# Patient Record
Sex: Male | Born: 1960 | Marital: Married | Smoking: Never smoker
Health system: Southern US, Community
[De-identification: ages and names within clinical notes are randomized; demographics above are authoritative.]

## PROBLEM LIST (undated history)

## (undated) DIAGNOSIS — R202 Paresthesia of skin: Secondary | ICD-10-CM

## (undated) DIAGNOSIS — E785 Hyperlipidemia, unspecified: Secondary | ICD-10-CM

## (undated) DIAGNOSIS — G459 Transient cerebral ischemic attack, unspecified: Secondary | ICD-10-CM

## (undated) DIAGNOSIS — I10 Essential (primary) hypertension: Secondary | ICD-10-CM

## (undated) HISTORY — DX: Transient cerebral ischemic attack, unspecified: G45.9

## (undated) HISTORY — DX: Hyperlipidemia, unspecified: E78.5

## (undated) HISTORY — PX: TONSILLECTOMY: SUR1361

## (undated) HISTORY — DX: Essential (primary) hypertension: I10

## (undated) HISTORY — DX: Paresthesia of skin: R20.2

---
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
## 2017-08-12 DIAGNOSIS — E785 Hyperlipidemia, unspecified: Secondary | ICD-10-CM | POA: Diagnosis not present

## 2017-08-12 DIAGNOSIS — I10 Essential (primary) hypertension: Secondary | ICD-10-CM | POA: Diagnosis not present

## 2017-08-12 DIAGNOSIS — R7301 Impaired fasting glucose: Secondary | ICD-10-CM | POA: Diagnosis not present

## 2017-08-12 DIAGNOSIS — Z125 Encounter for screening for malignant neoplasm of prostate: Secondary | ICD-10-CM | POA: Diagnosis not present

## 2017-08-28 ENCOUNTER — Ambulatory Visit (INDEPENDENT_AMBULATORY_CARE_PROVIDER_SITE_OTHER): Payer: 59

## 2017-08-28 ENCOUNTER — Encounter: Payer: Self-pay | Admitting: Podiatry

## 2017-08-28 ENCOUNTER — Ambulatory Visit (INDEPENDENT_AMBULATORY_CARE_PROVIDER_SITE_OTHER): Payer: 59 | Admitting: Podiatry

## 2017-08-28 DIAGNOSIS — M1000 Idiopathic gout, unspecified site: Secondary | ICD-10-CM

## 2017-08-28 DIAGNOSIS — M2010 Hallux valgus (acquired), unspecified foot: Secondary | ICD-10-CM

## 2017-08-28 DIAGNOSIS — M779 Enthesopathy, unspecified: Secondary | ICD-10-CM | POA: Diagnosis not present

## 2017-08-28 MED ORDER — TRIAMCINOLONE ACETONIDE 10 MG/ML IJ SUSP
10.0000 mg | Freq: Once | INTRAMUSCULAR | Status: AC
  Administered 2017-08-28: 10 mg

## 2017-08-28 NOTE — Progress Notes (Signed)
   Subjective:    Patient ID: Matthew Hayes, male    DOB: 09/19/1961, 56 y.o.   MRN: [id number]  HPI  Chief Complaint  Patient presents with  . Bunions    RT       Review of Systems  Musculoskeletal: Positive for arthralgias.  All other systems reviewed and are negative.      Objective:   Physical Exam        Assessment & Plan:

## 2017-08-28 NOTE — Patient Instructions (Addendum)
Bunion A bunion is a bump on the base of the big toe that forms when the bones of the big toe joint move out of position. Bunions may be small at first, but they often get larger over time. The can make walking painful. What are the causes? A bunion may be caused by:  Wearing narrow or pointed shoes that force the big toe to press against the other toes.  Abnormal foot development that causes the foot to roll inward (pronate).  Changes in the foot that are caused by certain diseases, such as rheumatoid arthritis and polio.  A foot injury.  What increases the risk? The following factors may make you more likely to develop this condition:  Wearing shoes that squeeze the toes together.  Having certain diseases, such as: ? Rheumatoid arthritis. ? Polio. ? Cerebral palsy.  Having family members who have bunions.  Being born with a foot deformity, such as flat feet or low arches.  Doing activities that put a lot of pressure on the feet, such as ballet dancing.  What are the signs or symptoms? The main symptom of a bunion is a noticeable bump on the big toe. Other symptoms may include:  Pain.  Swelling around the big toe.  Redness and inflammation.  Thick or hardened skin on the big toe or between the toes.  Stiffness or loss of motion in the big toe.  Trouble with walking.  How is this diagnosed? A bunion may be diagnosed based on your symptoms, medical history, and activities. You may have tests, such as:  X-rays. These allow your health care provider to check the position of the bones in your foot and look for damage to your joint. They also help your health care provider to determine the severity of your bunion and the best way to treat it.  Joint aspiration. In this test, a sample of fluid is removed from the toe joint. This test, which may be done if you are in a lot of pain, helps to rule out diseases that cause painful swelling of the joints, such as  arthritis.  How is this treated? There is no cure for a bunion, but treatment can help to prevent a bunion from getting worse. Treatment depends on the severity of your symptoms. Your health care provider may recommend:  Wearing shoes that have a wide toe box.  Using bunion pads to cushion the affected area.  Taping your toes together to keep them in a normal position.  Placing a device inside your shoe (orthotics) to help reduce pressure on your toe joint.  Taking medicine to ease pain, inflammation, and swelling.  Applying heat or ice to the affected area.  Doing stretching exercises.  Surgery to remove scar tissue and move the toes back into their normal position. This treatment is rare.  Follow these instructions at home:  Support your toe joint with proper footwear, shoe padding, or taping as told by your health care provider.  Take over-the-counter and prescription medicines only as told by your health care provider.  If directed, apply ice to the injured area: ? Put ice in a plastic bag. ? Place a towel between your skin and the bag. ? Leave the ice on for 20 minutes, 2-3 times per day.  If directed, apply heat to the affected area before you exercise. Use the heat source that your health care provider recommends, such as a moist heat pack or a heating pad. ? Place a towel between your   skin and the heat source. ? Leave the heat on for 20-30 minutes. ? Remove the heat if your skin turns bright red. This is especially important if you are unable to feel pain, heat, or cold. You may have a greater risk of getting burned.  Do exercises as told by your health care provider.  Keep all follow-up visits as told by your health care provider. Contact a health care provider if:  Your symptoms get worse.  Your symptoms do not improve in 2 weeks. Get help right away if:  You have severe pain and trouble with walking. This information is not intended to replace advice given  to you by your health care provider. Make sure you discuss any questions you have with your health care provider. Document Released: 12/15/2005 Document Revised: 05/22/2016 Document Reviewed: 07/15/2015 Elsevier Interactive Patient Education  2018 [employer] Calcium Pyrophosphate Deposition Calcium pyrophosphate deposition (CPPD), which is also called pseudogout, is a type of arthritis that causes pain, swelling, and inflammation in a joint. The joint pain can be severe and may last for days. If it is not treated, the pain may last much longer. Attacks of CPPD may come and go. This condition usually affects one joint at a time. The joints that are affected most commonly are the knees, but this condition can also affect the wrists, elbows, shoulders, or ankles. CPPD is similar to gout. Both conditions result from the buildup of crystals in the joint. However, CPPD is caused by a type of crystal that is different than the crystals that cause gout. What are the causes? This condition is caused by the buildup of calcium pyrophosphate dihydrate crystals in the joint. The reason why this buildup occurs is not known. The condition may be passed down from parent to child (hereditary). What increases the risk? This condition is more likely to develop in people who: Are over 54 years old. Have a family history of the condition. Have had joint replacement surgery. Have had a recent injury. Have certain medical conditions, such as hemophilia, ochronosis, amyloidosis, or hormonal disorders. Have low blood magnesium levels.  What are the signs or symptoms? Symptoms of this condition include: Pain in a joint. The pain may: Be intense and constant. Come on quickly. Get worse with movement. Last from several days to a few weeks. Redness, swelling, and warmth at the joint. Stiffness of the joint.  How is this diagnosed? To diagnose this condition, your health care provider will use a needle to remove  fluid from the joint. The fluid will be examined under a microscope to check for the crystals that cause CPPD. You may also have imaging tests, such as: X-rays. Ultrasound.  How is this treated? There is no way to remove the crystals from the joint and no way to cure this condition. However, treatment can relieve symptoms and improve joint function. Treatment may include: Nonsteroidal anti-inflammatory drugs (NSAIDs) to reduce inflammation and pain. Medicines to help prevent attacks. Injections of medicine (cortisone) into the joint to reduce pain and swelling. Physical therapy to improve joint function.  Follow these instructions at home: Take medicines only as directed by your health care provider. Rest the affected joints until your symptoms start to go away. Keep your affected joints raised (elevated) when possible. This will help to reduce swelling. If directed, apply ice to the affected area: Put ice in a plastic bag. Place a towel between your skin and the bag. Leave the ice on for 20 minutes, 2-3 times  per day. If the painful joint is in your leg, use crutches as directed by your health care provider. When your symptoms start to go away, begin to exercise regularly or do physical therapy. Talk with your health care provider or physical therapist about what types of exercise are safe for you. Low-impact exercise may be best. This includes walking, swimming, bicycling, and water aerobics. Maintain a healthy weight so your joints do not need to bear more weight than necessary. Contact a health care provider if: You have an increase in joint pain that is not relieved with medicine. Your joint becomes more red, swollen, or stiff. You have a fever. You have a skin rash. This information is not intended to replace advice given to you by your health care provider. Make sure you discuss any questions you have with your health care provider. Document Released: 09/06/2004 Document Revised:  05/22/2016 Document Reviewed: 11/22/2014 Elsevier Interactive Patient Education  [employer].

## 2017-08-28 NOTE — Progress Notes (Signed)
Subjective:    Patient ID: Matthew Hayes, male   DOB: 56 y.o.   MRN: [id number]   HPI patient states she's getting a lot of pain in his right foot on both sides of the joint with inflammation fluid and knows that he has structural bunion deformity but not sure if this is the problem    Review of Systems  All other systems reviewed and are negative.       Objective:  Physical Exam  Constitutional: He appears well-developed and well-nourished.  Cardiovascular: Intact distal pulses.   Pulmonary/Chest: Effort normal.  Musculoskeletal: Normal range of motion.  Neurological: He is alert.  Skin: Skin is warm.  Nursing note and vitals reviewed.  neurovascular status intact muscle strength was adequate range of motion within normal limits with patient found to have moderate structural deformity of the first and fifth MPJ right with inflammation fluid buildup around the joint surfaces but no indication currently of an acute process. 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 Patient will wider shoes and ultimately may require structural correction  X-rays indicate there is elevation of the intermetatarsal angle right with structural bunion and tailor's bunion deformity

## 2017-08-29 LAB — URIC ACID: Uric Acid, Serum: 6.7 mg/dL (ref 4.0–8.0)

## 2017-08-29 LAB — SEDIMENTATION RATE: SED RATE: 5 mm/h (ref 0–20)

## 2017-09-01 LAB — C-REACTIVE PROTEIN: CRP: 1.5 mg/L (ref <8.0)

## 2017-09-01 LAB — RHEUMATOID FACTOR: Rhuematoid fact SerPl-aCnc: <14 IU/mL (ref <14)

## 2017-09-01 LAB — ANA, IFA COMPREHENSIVE PANEL
Anti Nuclear Antibody(ANA): NEGATIVE
ENA SM Ab Ser-aCnc: <1
SCLERODERMA (SCL-70) (ENA) ANTIBODY, IGG: NEGATIVE
SM/RNP: NEGATIVE
SSA (Ro) (ENA) Antibody, IgG: <1
SSB (La) (ENA) Antibody, IgG: <1
ds DNA Ab: <1 IU/mL

## 2017-09-02 ENCOUNTER — Ambulatory Visit: Payer: Self-pay | Admitting: Podiatry

## 2018-02-01 DIAGNOSIS — Z719 Counseling, unspecified: Secondary | ICD-10-CM | POA: Diagnosis not present

## 2018-03-08 DIAGNOSIS — E785 Hyperlipidemia, unspecified: Secondary | ICD-10-CM | POA: Diagnosis not present

## 2018-03-08 DIAGNOSIS — Z0000 Encounter for general adult medical examination without abnormal findings: Secondary | ICD-10-CM | POA: Diagnosis not present

## 2018-03-08 DIAGNOSIS — Z125 Encounter for screening for malignant neoplasm of prostate: Secondary | ICD-10-CM | POA: Diagnosis not present

## 2018-03-08 DIAGNOSIS — R7301 Impaired fasting glucose: Secondary | ICD-10-CM | POA: Diagnosis not present

## 2018-03-08 DIAGNOSIS — I10 Essential (primary) hypertension: Secondary | ICD-10-CM | POA: Diagnosis not present

## 2018-06-11 DIAGNOSIS — J0100 Acute maxillary sinusitis, unspecified: Secondary | ICD-10-CM | POA: Diagnosis not present

## 2018-08-03 DIAGNOSIS — R42 Dizziness and giddiness: Secondary | ICD-10-CM | POA: Diagnosis not present

## 2018-08-03 DIAGNOSIS — R202 Paresthesia of skin: Secondary | ICD-10-CM | POA: Diagnosis not present

## 2018-08-03 DIAGNOSIS — E7800 Pure hypercholesterolemia, unspecified: Secondary | ICD-10-CM | POA: Diagnosis not present

## 2018-08-03 DIAGNOSIS — R200 Anesthesia of skin: Secondary | ICD-10-CM | POA: Diagnosis not present

## 2018-08-03 DIAGNOSIS — I6521 Occlusion and stenosis of right carotid artery: Secondary | ICD-10-CM | POA: Diagnosis not present

## 2018-08-03 DIAGNOSIS — I10 Essential (primary) hypertension: Secondary | ICD-10-CM | POA: Diagnosis not present

## 2018-08-03 DIAGNOSIS — R079 Chest pain, unspecified: Secondary | ICD-10-CM | POA: Diagnosis not present

## 2018-08-04 DIAGNOSIS — X32XXXD Exposure to sunlight, subsequent encounter: Secondary | ICD-10-CM | POA: Diagnosis not present

## 2018-08-04 DIAGNOSIS — L570 Actinic keratosis: Secondary | ICD-10-CM | POA: Diagnosis not present

## 2018-08-04 DIAGNOSIS — L255 Unspecified contact dermatitis due to plants, except food: Secondary | ICD-10-CM | POA: Diagnosis not present

## 2018-08-18 ENCOUNTER — Other Ambulatory Visit: Payer: Self-pay | Admitting: Family Medicine

## 2018-08-18 DIAGNOSIS — R9389 Abnormal findings on diagnostic imaging of other specified body structures: Secondary | ICD-10-CM

## 2018-08-18 DIAGNOSIS — G459 Transient cerebral ischemic attack, unspecified: Secondary | ICD-10-CM

## 2018-08-31 ENCOUNTER — Ambulatory Visit
Admission: RE | Admit: 2018-08-31 | Discharge: 2018-08-31 | Disposition: A | Discharge status: Home / Self Care | Payer: 59 | Source: Ambulatory Visit | Attending: Family Medicine | Admitting: Family Medicine

## 2018-08-31 DIAGNOSIS — G459 Transient cerebral ischemic attack, unspecified: Secondary | ICD-10-CM

## 2018-08-31 DIAGNOSIS — R9389 Abnormal findings on diagnostic imaging of other specified body structures: Secondary | ICD-10-CM

## 2018-08-31 DIAGNOSIS — I671 Cerebral aneurysm, nonruptured: Secondary | ICD-10-CM | POA: Diagnosis not present

## 2018-09-01 ENCOUNTER — Ambulatory Visit: Payer: 59 | Admitting: Podiatry

## 2018-09-01 ENCOUNTER — Encounter: Payer: Self-pay | Admitting: Podiatry

## 2018-09-01 ENCOUNTER — Other Ambulatory Visit: Payer: Self-pay | Admitting: Podiatry

## 2018-09-01 ENCOUNTER — Ambulatory Visit (INDEPENDENT_AMBULATORY_CARE_PROVIDER_SITE_OTHER): Payer: 59

## 2018-09-01 DIAGNOSIS — M7752 Other enthesopathy of left foot: Secondary | ICD-10-CM | POA: Diagnosis not present

## 2018-09-01 DIAGNOSIS — M779 Enthesopathy, unspecified: Secondary | ICD-10-CM | POA: Diagnosis not present

## 2018-09-01 DIAGNOSIS — M79672 Pain in left foot: Secondary | ICD-10-CM

## 2018-09-01 DIAGNOSIS — M778 Other enthesopathies, not elsewhere classified: Secondary | ICD-10-CM

## 2018-09-01 MED ORDER — DICLOFENAC SODIUM 75 MG PO TBEC: 75.0000 mg | DELAYED_RELEASE_TABLET | Freq: Two times a day (BID) | ORAL | 2 refills | Status: DC

## 2018-09-01 MED ORDER — TRIAMCINOLONE ACETONIDE 10 MG/ML IJ SUSP
10.0000 mg | Freq: Once | INTRAMUSCULAR | Status: AC
  Administered 2018-09-01: 10 mg

## 2018-09-01 NOTE — Progress Notes (Signed)
Subjective:   Patient ID: Matthew Hayes, male   DOB: 57 y.o.   MRN: [id number]   HPI Patient presents stating that he has had a lot of pain on top of his left foot and he does not remember specific injury.  Patient also has had history of bunion deformity right and states that has been pretty good since we treated the last time and had a tentative diagnosis of gout.  States the pain in the left is been present for 2 months and is worse with shoe gear   ROS      Objective:  Physical Exam  Neurovascular status intact with patient found to have inflammation pain of the dorsal left foot and the extensor tendon complex is localized in nature midfoot with no distal pain or medial lateral pain with mild discomfort on the right foot     Assessment:  Tendinitis dorsal left with mild inflammation right     Plan:  H&P condition reviewed and today had a careful injection of the dorsal tendon group left 3 mg Kenalog 5 mg Xylocaine advised on heat therapy shoe gear modifications as well as lacing and reappoint again as symptoms indicate  X-ray indicates no indications that there is a stress fracture or advanced arthritic issue in the midfoot left

## 2018-09-13 DIAGNOSIS — R7301 Impaired fasting glucose: Secondary | ICD-10-CM | POA: Diagnosis not present

## 2018-09-13 DIAGNOSIS — Z23 Encounter for immunization: Secondary | ICD-10-CM | POA: Diagnosis not present

## 2018-09-13 DIAGNOSIS — R202 Paresthesia of skin: Secondary | ICD-10-CM | POA: Diagnosis not present

## 2018-09-13 DIAGNOSIS — R079 Chest pain, unspecified: Secondary | ICD-10-CM | POA: Diagnosis not present

## 2018-10-05 DIAGNOSIS — L308 Other specified dermatitis: Secondary | ICD-10-CM | POA: Diagnosis not present

## 2018-10-18 DIAGNOSIS — R0789 Other chest pain: Secondary | ICD-10-CM | POA: Diagnosis not present

## 2018-10-18 DIAGNOSIS — R200 Anesthesia of skin: Secondary | ICD-10-CM | POA: Diagnosis not present

## 2018-10-18 DIAGNOSIS — E7800 Pure hypercholesterolemia, unspecified: Secondary | ICD-10-CM | POA: Diagnosis not present

## 2018-10-24 IMAGING — MR MR MRA HEAD W/O CM
1 series · 11 of 48 positions shown · non-contrast
Comparison: None.

CLINICAL DATA: Abnormal outside CTA, reportedly with a possible 2
mm right MCA bifurcation aneurysm. Left neck stiffness with left
face and arm numbness for 1 month.

EXAM:
MRA HEAD WITHOUT CONTRAST
TECHNIQUE: Angiographic images of the Circle of Willis were obtained using MRA
technique without intravenous contrast.

[Series 5: [id number] · axial · 0.6mm · 0.26mm/px · z∈[-50,+36]mm · 11 of 168 slices shown]
[im 11/168]
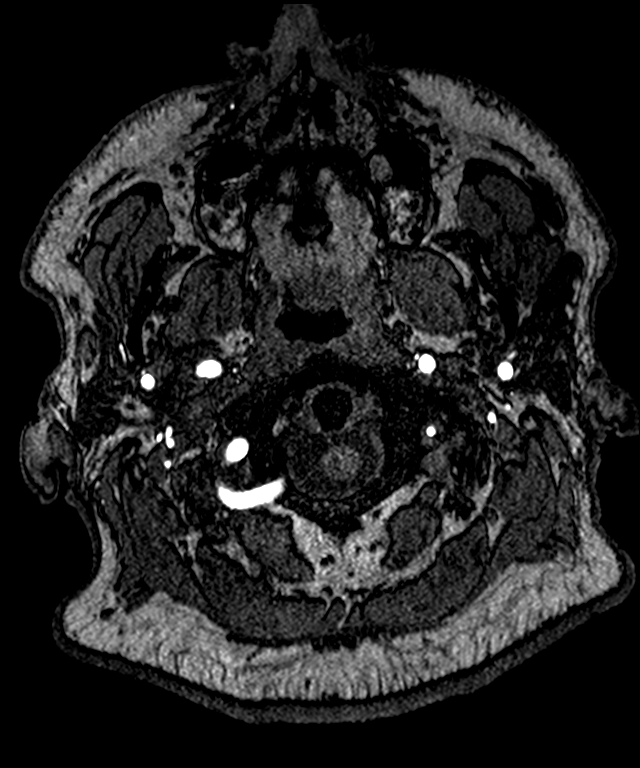
[im 29/168]
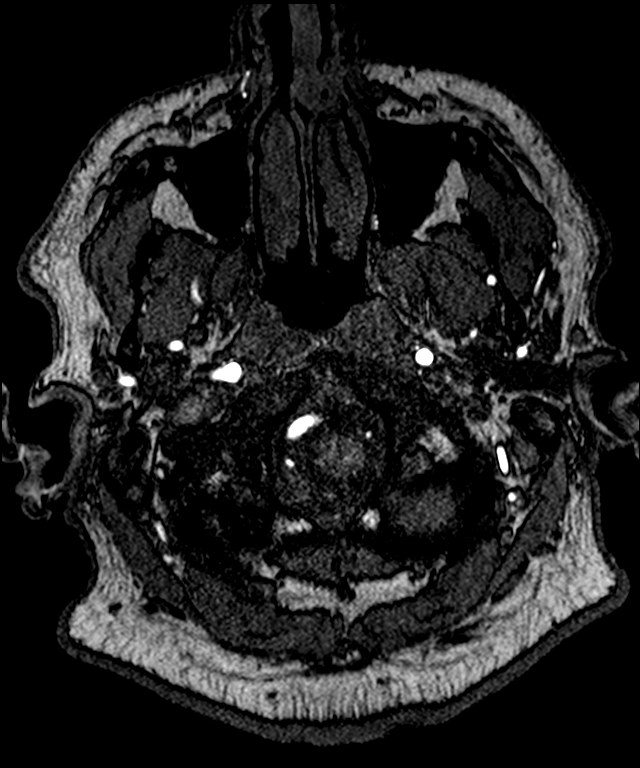
[im 32/168]
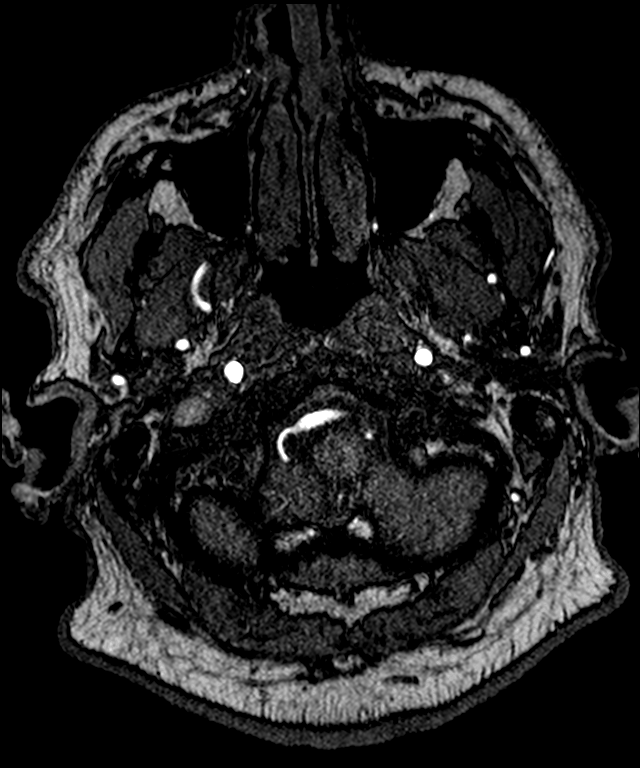
[im 54/168]
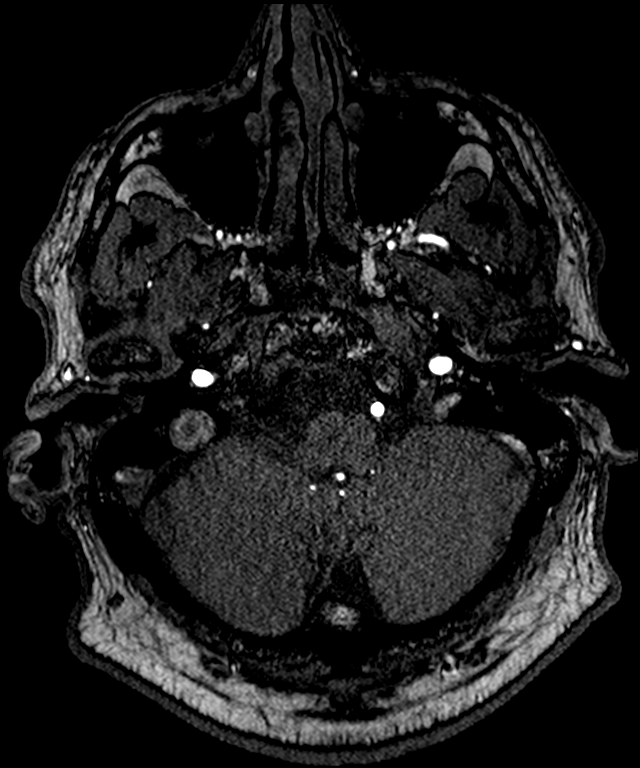
[im 75/168]
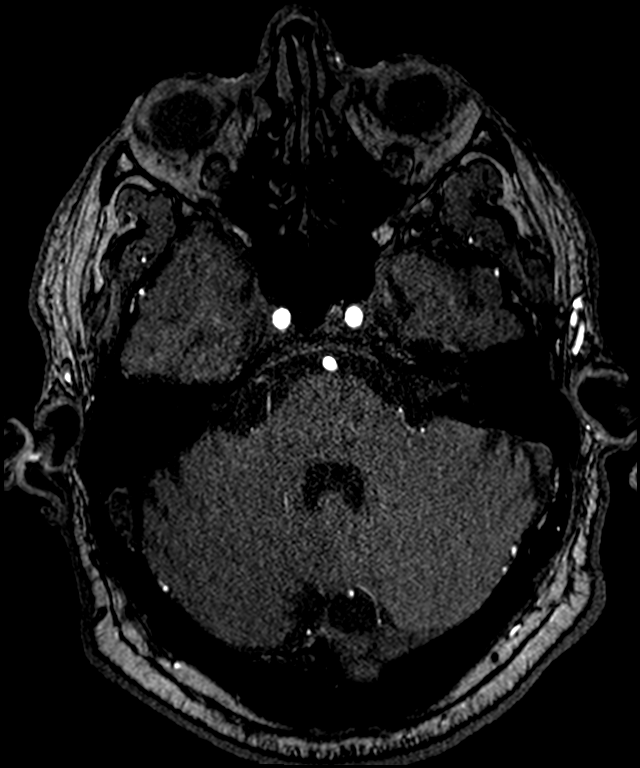
[im 86/168]
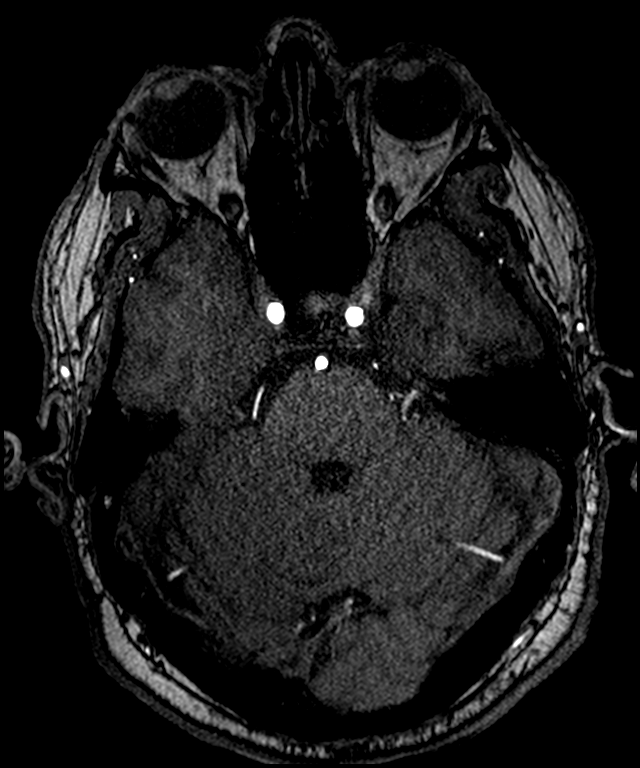
[im 96/168]
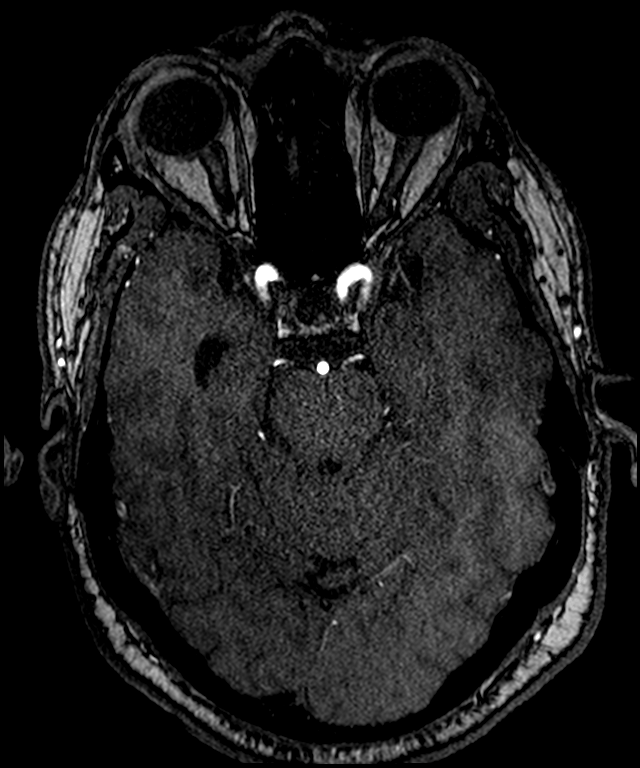
[im 118/168]
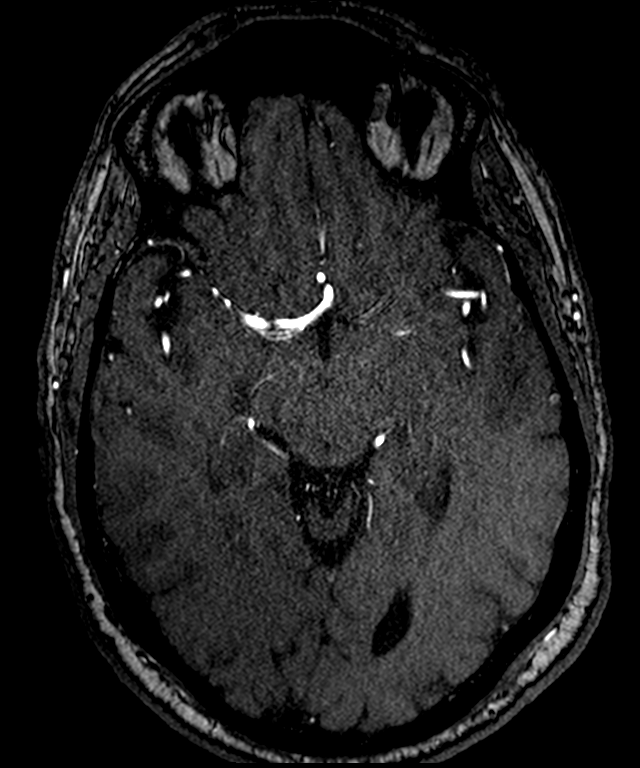
[im 139/168]
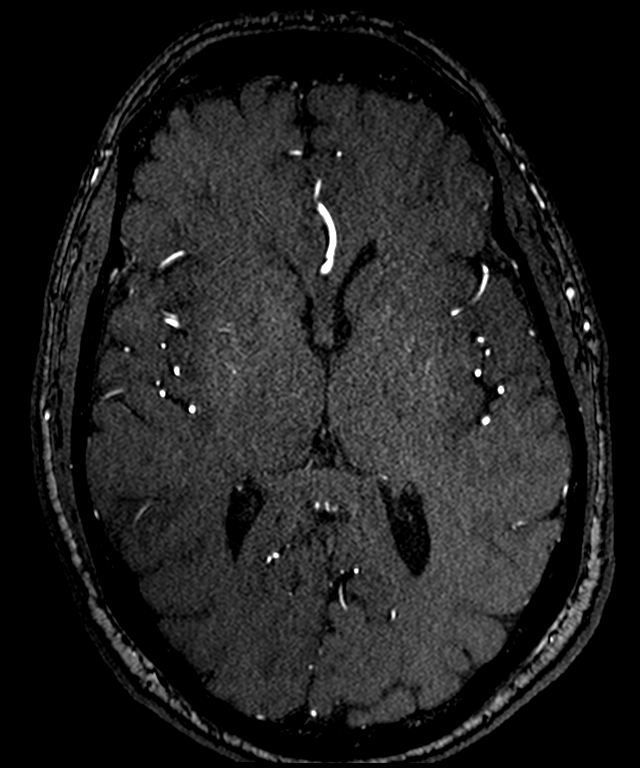
[im 143/168]
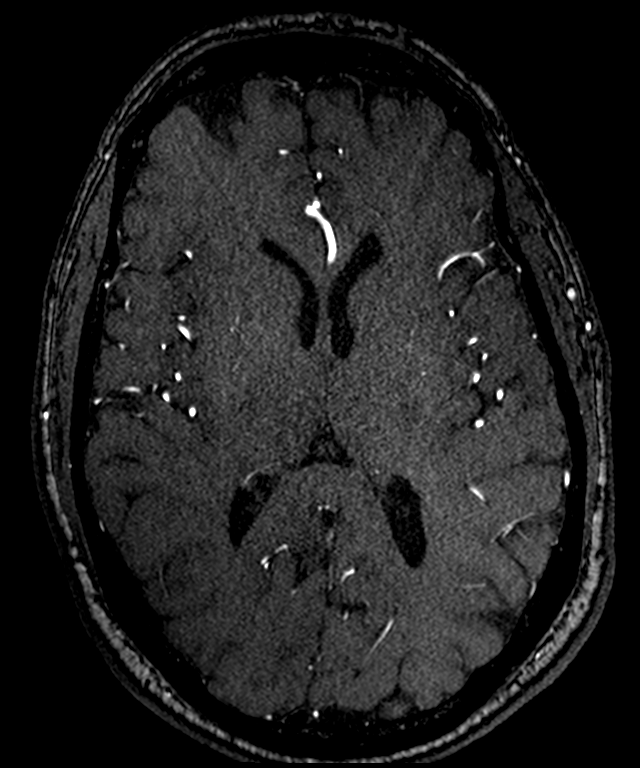
[im 160/168]
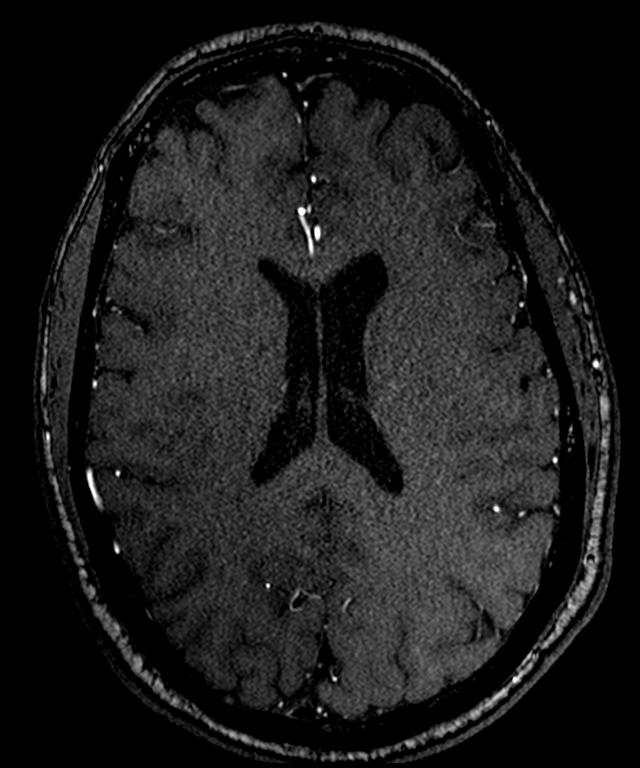

[11 of 48 positions shown; findings below may reference images not displayed]

FINDINGS: The visualized distal vertebral artery is widely patent and
dominant, supplying the basilar. The left vertebral artery is small
and terminates in PICA. Patent AICA and SCA origins are identified
bilaterally. The basilar artery is widely patent. There are small
right and diminutive or absent left posterior communicating
arteries. PCAs are patent without evidence of significant stenosis.

The internal carotid arteries are widely patent from skull base to
carotid termini. Tiny infundibula are noted from the supraclinoid
ICAs bilaterally. ACAs and MCAs are patent without evidence of
proximal branch occlusion or significant stenosis. No aneurysm is
identified.
IMPRESSION: Negative head MRA.  No aneurysm identified.

## 2018-11-08 DIAGNOSIS — E785 Hyperlipidemia, unspecified: Secondary | ICD-10-CM | POA: Diagnosis not present

## 2018-11-08 DIAGNOSIS — R0789 Other chest pain: Secondary | ICD-10-CM | POA: Diagnosis not present

## 2019-04-07 ENCOUNTER — Ambulatory Visit: Payer: 59 | Admitting: Cardiology

## 2019-04-07 ENCOUNTER — Telehealth: Payer: Self-pay

## 2019-04-07 ENCOUNTER — Other Ambulatory Visit: Payer: Self-pay

## 2019-04-07 ENCOUNTER — Encounter: Payer: Self-pay | Admitting: Cardiology

## 2019-04-07 VITALS — BP 162/95 | HR 89 | Ht 69.0 in | Wt 262.0 lb

## 2019-04-07 DIAGNOSIS — I10 Essential (primary) hypertension: Secondary | ICD-10-CM

## 2019-04-07 DIAGNOSIS — R002 Palpitations: Secondary | ICD-10-CM

## 2019-04-07 DIAGNOSIS — K219 Gastro-esophageal reflux disease without esophagitis: Secondary | ICD-10-CM

## 2019-04-07 NOTE — Telephone Encounter (Signed)
Ok to bring in. Please go through screening questions.  Thanks MJP

## 2019-04-07 NOTE — Progress Notes (Signed)
Follow up visit  Subjective:   Matthew Hayes, male    DOB: 05/22/1961, 58 y.o.   MRN: [id number]   Chief Complaint  Patient presents with  . Palpitations    pt c/o heart flutters , burping a lot     HPI  58 y.o. Caucasian male with hypertension, hyperlipidemia, here for acute visit for palpitations.   Patient's symptoms has only occurred last night and this morning. Both times, they occurred after eating his meals. He reports burping sensation, that is worse with laying down and better with sitting up. He denies any heart racing sensation, chest pain, shortness of breath. Blood pressure elevated today, but usually normal.    Past Medical History:  Diagnosis Date  . Hyperlipidemia   . Hypertension      History reviewed. No pertinent surgical history.   Social History   Socioeconomic History  . Marital status: Unknown    Spouse name: Not on file  . Number of children: Not on file  . Years of education: Not on file  . Highest education level: Not on file  Occupational History  . Not on file  Social Needs  . Financial resource strain: Not on file  . Food insecurity:    Worry: Not on file    Inability: Not on file  . Transportation needs:    Medical: Not on file    Non-medical: Not on file  Tobacco Use  . Smoking status: Never Smoker  . Smokeless tobacco: Never Used  Substance and Sexual Activity  . Alcohol use: No  . Drug use: No  . Sexual activity: Not on file  Lifestyle  . Physical activity:    Days per week: Not on file    Minutes per session: Not on file  . Stress: Not on file  Relationships  . Social connections:    Talks on phone: Not on file    Gets together: Not on file    Attends religious service: Not on file    Active member of club or organization: Not on file    Attends meetings of clubs or organizations: Not on file    Relationship status: Not on file  . Intimate partner violence:    Fear of current or ex partner: Not on file   Emotionally abused: Not on file    Physically abused: Not on file    Forced sexual activity: Not on file  Other Topics Concern  . Not on file  Social History Narrative  . Not on file     Family History  Problem Relation Age of Onset  . Stroke Mother     Current Outpatient Medications on File Prior to Visit  Medication Sig Dispense Refill  . diclofenac (VOLTAREN) 75 MG EC tablet Take 1 tablet (75 mg total) by mouth 2 (two) times daily. 50 tablet 2  . losartan (COZAAR) 25 MG tablet Take 25 mg by mouth daily.    . simvastatin (ZOCOR) 40 MG tablet Take 40 mg by mouth every evening.  1   No current facility-administered medications on file prior to visit.     Cardiovascular studies:  EKG 04/07/2019: Sinus rhythm 87 bpm Normal EKG  Exercise Treadmill Stress Test 11/08/2018:  Indication:  Chest pain The patient exercised on Bruce protocol for  09:04 min. Patient achieved  10.24 METS and reached HR   180 bpm, which is  127 % of maximum age-predicted HR.  Stress test terminated due to fatigue.   Exercise capacity was  normal. HR Response to Exercise: Appropriate. BP Response to Exercise: Normal resting BP- appropriate response. Chest Pain: none. Arrhythmias: none. Resting EKG demonstrates Normal sinus rhythm. ST Changes: With peak exercise, there were 1 mm horizontal ST depressions in leads V4-V6 that persisted two min into recovery.   Overall Impression: Positive stress test typical of ischemia. Recommendations: Consider further cardiac evaluation , if clinically indicated.  Review of Systems  Constitution: Negative for decreased appetite, malaise/fatigue, weight gain and weight loss.  HENT: Negative for congestion.   Eyes: Negative for visual disturbance.  Cardiovascular: Negative for chest pain, dyspnea on exertion, leg swelling, palpitations and syncope.  Respiratory: Negative for shortness of breath.   Endocrine: Negative for cold intolerance.  Hematologic/Lymphatic:  Does not bruise/bleed easily.  Skin: Negative for itching and rash.  Musculoskeletal: Negative for myalgias.  Gastrointestinal: Positive for bloating and heartburn. Negative for abdominal pain, nausea and vomiting.  Genitourinary: Negative for dysuria.  Neurological: Negative for dizziness and weakness.  Psychiatric/Behavioral: The patient is not nervous/anxious.   All other systems reviewed and are negative.        Vitals:   04/07/19 1343  BP: (!) 162/95  Pulse: 89  SpO2: 95%    Objective:   Physical Exam  Constitutional: He is oriented to person, place, and time. He appears well-developed and well-nourished. No distress.  HENT:  Head: Normocephalic and atraumatic.  Eyes: Pupils are equal, round, and reactive to light. Conjunctivae are normal.  Neck: No JVD present.  Cardiovascular: Normal rate, regular rhythm and intact distal pulses.  Pulmonary/Chest: Effort normal and breath sounds normal. He has no wheezes. He has no rales.  Abdominal: Soft. Bowel sounds are normal. There is no rebound.  Musculoskeletal:        General: No edema.  Lymphadenopathy:    He has no cervical adenopathy.  Neurological: He is alert and oriented to person, place, and time. No cranial nerve deficit.  Skin: Skin is warm and dry.  Psychiatric: He has a normal mood and affect.  Nursing note and vitals reviewed.         Assessment & Recommendations:   58 y.o. Caucasian male with here for acute visit for palpitations.   1. Palpitations On further review, I do not think his symptoms are related to heart rhythm. Resting EKG is normal. I suspect GERD  2. Gastroesophageal reflux disease, esophagitis presence not specified Recommend OTC omeprazole.  3. Hypertension Blood pressure elevated today, but usually normal. No change made today.  Follow up VV in 3 weeks to ensure improvement in symptoms.      Elder Negus, MD [facility] [place] Pager: [phone] Office:  [phone] If no answer Cell [phone]

## 2019-04-25 ENCOUNTER — Encounter: Payer: Self-pay | Admitting: Cardiology

## 2019-04-26 ENCOUNTER — Other Ambulatory Visit: Payer: Self-pay

## 2019-04-26 ENCOUNTER — Encounter: Payer: Self-pay | Admitting: Cardiology

## 2019-04-26 ENCOUNTER — Ambulatory Visit (INDEPENDENT_AMBULATORY_CARE_PROVIDER_SITE_OTHER): Payer: 59 | Admitting: Cardiology

## 2019-04-26 VITALS — BP 122/78 | HR 83 | Ht 69.0 in | Wt 255.0 lb

## 2019-04-26 DIAGNOSIS — R002 Palpitations: Secondary | ICD-10-CM

## 2019-04-26 NOTE — Progress Notes (Signed)
\   Virtual Visit via Video Note   Subjective:   Matthew Hayes, male    DOB: [birth year], 58 y.o.   MRN: [id number]   I connected with the patient on 04/26/19 by a video enabled telemedicine application and verified that I am speaking with the correct person using two identifiers.     I discussed the limitations of evaluation and management by telemedicine and the availability of in person appointments. The patient expressed understanding and agreed to proceed.   This visit type was conducted due to national recommendations for restrictions regarding the COVID-19 Pandemic (e.g. social distancing).  This format is felt to be most appropriate for this patient at this time.  All issues noted in this document were discussed and addressed.  No physical exam was performed (except for noted visual exam findings with Tele health visits).  The patient has consented to conduct a Tele health visit and understands insurance will be billed.     Chief complaint:  Palpitations   HPI  58 y.o. [name] with hypertension, palpitations.  Patient continues to have random episodes of palpitations lasting for a few min, always while at rest. He walks 15,000 steps everyday without any chest pain, shortness of breath symptoms. He is convinced that his symptoms are a side effect of valsartan and wants to come off it.   I have re reviewed his exercise treadmill stress test from 10/2018. His ST depressions are <1 mm upsloping/horizontal, and are equivocal of ischemia, at best.   Past Medical History:  Diagnosis Date  . Hyperlipidemia   . Hypertension       Social History   Socioeconomic History  . Marital status: Married    Spouse name: Not on file  . Number of children: 3  . Years of education: Not on file  . Highest education level: Not on file  Occupational History  . Not on file  Social Needs  . Financial resource strain: Not on file  . Food insecurity:    Worry: Not on file   Inability: Not on file  . Transportation needs:    Medical: Not on file    Non-medical: Not on file  Tobacco Use  . Smoking status: Never Smoker  . Smokeless tobacco: Never Used  Substance and Sexual Activity  . Alcohol use: No  . Drug use: No  . Sexual activity: Not on file  Lifestyle  . Physical activity:    Days per week: Not on file    Minutes per session: Not on file  . Stress: Not on file  Relationships  . Social connections:    Talks on phone: Not on file    Gets together: Not on file    Attends religious service: Not on file    Active member of club or organization: Not on file    Attends meetings of clubs or organizations: Not on file    Relationship status: Not on file  . Intimate partner violence:    Fear of current or ex partner: Not on file    Emotionally abused: Not on file    Physically abused: Not on file    Forced sexual activity: Not on file  Other Topics Concern  . Not on file  Social History Narrative  . Not on file     Family History  Problem Relation Age of Onset  . Stroke Mother   . Hypertension Father   . Hypertension Sister   . Hypertension Brother  Current Outpatient Medications on File Prior to Visit  Medication Sig Dispense Refill  . Multiple Vitamins-Minerals (MULTIVITAMIN WITH MINERALS) tablet Take 1 tablet by mouth daily.    . diclofenac (VOLTAREN) 75 MG EC tablet Take 1 tablet (75 mg total) by mouth 2 (two) times daily. (Patient not taking: Reported on 04/07/2019) 50 tablet 2  . losartan (COZAAR) 100 MG tablet Take 100 mg by mouth daily.    . simvastatin (ZOCOR) 40 MG tablet Take 40 mg by mouth every evening.  1   No current facility-administered medications on file prior to visit.     Cardiovascular studies:  EKG 04/07/2019: Sinus rhythm 87 bpm Normal EKG  Exercise Treadmill Stress Test 11/08/2018:  Indication: Chest pain The patient exercised on Bruce protocol for 09:04 min. Patient achieved 10.24 METS and reached  HR 180 bpm, which is 127 % of maximum age-predicted HR. Stress test terminated due to fatigue.   Exercise capacity was normal. HR Response to Exercise: Appropriate. BP Response to Exercise: Normal resting BP- appropriate response. Chest Pain: none. Arrhythmias: none. Resting EKG demonstrates Normal sinus rhythm. ST Changes: With peak exercise, there were 1 mm horizontal ST depressions in leads V4-V6 that persisted two min into recovery.   Overall Impression: Positive stress test typical of ischemia. Recommendations: Consider further cardiac evaluation , if clinically indicated.   Review of Systems  Constitution: Negative for decreased appetite, malaise/fatigue, weight gain and weight loss.  HENT: Negative for congestion.   Eyes: Negative for visual disturbance.  Cardiovascular: Positive for palpitations. Negative for chest pain, dyspnea on exertion, leg swelling and syncope.  Respiratory: Negative for shortness of breath.   Endocrine: Negative for cold intolerance.  Hematologic/Lymphatic: Does not bruise/bleed easily.  Skin: Negative for itching and rash.  Musculoskeletal: Negative for myalgias.  Gastrointestinal: Negative for abdominal pain, nausea and vomiting.  Genitourinary: Negative for dysuria.  Neurological: Negative for dizziness and weakness.  Psychiatric/Behavioral: The patient is not nervous/anxious.   All other systems reviewed and are negative.        Vitals:   04/26/19 1449  BP: 122/78  Pulse: 83   (Measured by the patient using a home BP monitor)   Observation/findings during video visit   Objective:    Physical Exam  Constitutional: He is oriented to person, place, and time. He appears well-developed and well-nourished. No distress.  Pulmonary/Chest: Effort normal.  Neurological: He is alert and oriented to person, place, and time.  Psychiatric: He has a normal mood and affect.  Nursing note and vitals reviewed.         Assessment &  Recommendations:   58 y.o. Caucasian male with here for acute visit for palpitations.   1. Palpitations Continues to have random episodes of palpitations. He wants to come off losartan and is convinced it is due to the medication. It is reasonable to try this, although I remain skeptical. Decrease to half tab a day and then stop. I offered to start him on a beta blocker, but he would like to hold off for now.  If symptoms persistent at 3 weeks, will then place event monitor.   I have re reviewed his exercise treadmill stress test from 10/2018. His ST depressions are <1 mm upsloping/horizontal, and are equivocal of ischemia, at best. Coupled with excellent exercise capacity and absence of angina symptoms, I do not think his stress test is positive for ischemia. Conitnue statin for now.    Elder Negus, MD [facility] Pager: [phone] Office: [phone]  If no answer Cell [phone]

## 2019-04-27 DIAGNOSIS — I10 Essential (primary) hypertension: Secondary | ICD-10-CM | POA: Diagnosis not present

## 2019-04-27 DIAGNOSIS — R7301 Impaired fasting glucose: Secondary | ICD-10-CM | POA: Diagnosis not present

## 2019-04-27 DIAGNOSIS — E785 Hyperlipidemia, unspecified: Secondary | ICD-10-CM | POA: Diagnosis not present

## 2019-04-28 ENCOUNTER — Encounter: Payer: Self-pay | Admitting: *Deleted

## 2019-05-02 ENCOUNTER — Other Ambulatory Visit: Payer: Self-pay

## 2019-05-02 ENCOUNTER — Encounter: Payer: Self-pay | Admitting: Diagnostic Neuroimaging

## 2019-05-02 ENCOUNTER — Ambulatory Visit (INDEPENDENT_AMBULATORY_CARE_PROVIDER_SITE_OTHER): Payer: 59 | Admitting: Diagnostic Neuroimaging

## 2019-05-02 DIAGNOSIS — G459 Transient cerebral ischemic attack, unspecified: Secondary | ICD-10-CM | POA: Diagnosis not present

## 2019-05-02 DIAGNOSIS — R202 Paresthesia of skin: Secondary | ICD-10-CM

## 2019-05-02 DIAGNOSIS — R200 Anesthesia of skin: Secondary | ICD-10-CM | POA: Diagnosis not present

## 2019-05-02 NOTE — Progress Notes (Signed)
[facility]  PATIENT: Matthew Hayes DOB: 1961-07-05  REFERRING CLINICIAN: C White HISTORY FROM: patient REASON FOR VISIT: new consult    HISTORICAL  CHIEF COMPLAINT:  Chief Complaint  Patient presents with  . Numbness    HISTORY OF PRESENT ILLNESS:   58 year old male with hypertension, hypercholesterolemia, here for evaluation of numbness since August 2019.  Patient had onset of left face and left arm numbness lasting for few minutes at a time in August 2018.  This was happening several times per week.  He had a severe headache at onset of symptoms.  Patient went to the hospital in [facility] for evaluation.  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  This was followed up with MRA of the head in [place] in September 2019 which was normal.  Patient continues to have episodes at least twice per week.  No specific triggering or aggravating factors.  No weakness.  No slurred speech, vision changes, trouble walking anything with his hand.  No ongoing headaches.  No twitching or convulsions.    REVIEW OF SYSTEMS: Full 14 system review of systems performed and negative with exception of: As per HPI.  ALLERGIES: Allergies  Allergen Reactions  . Vicodin [Hydrocodone-Acetaminophen] Nausea Only    Sick on stomach    HOME MEDICATIONS: Outpatient Medications Prior to Visit  Medication Sig Dispense Refill  . diclofenac (VOLTAREN) 75 MG EC tablet Take 1 tablet (75 mg total) by mouth 2 (two) times daily. 50 tablet 2  . losartan (COZAAR) 100 MG tablet Take 100 mg by mouth daily.    . Multiple Vitamins-Minerals (MULTIVITAMIN WITH MINERALS) tablet Take 1 tablet by mouth daily.    . simvastatin (ZOCOR) 40 MG tablet Take 40 mg by mouth every evening.  1   No facility-administered medications prior to visit.     PAST MEDICAL HISTORY: Past Medical History:   Diagnosis Date  . Hyperlipidemia   . Hypertension   . Paresthesia    left face    PAST SURGICAL HISTORY: No past surgical history on file.  FAMILY HISTORY: Family History  Problem Relation Age of Onset  . Stroke Mother   . Hypertension Father   . Hypertension Sister   . Hypertension Brother     SOCIAL HISTORY: Social History   Socioeconomic History  . Marital status: Married    Spouse name: Matthew Hayes  . Number of children: 3  . Years of education: Not on file  . Highest education level: Not on file  Occupational History    Comment: [employer]  Social Needs  . Financial resource strain: Not on file  . Food insecurity:    Worry: Not on file    Inability: Not on file  . Transportation needs:    Medical: Not on file    Non-medical: Not on file  Tobacco Use  . Smoking status: Never Smoker  . Smokeless tobacco: Never Used  Substance and Sexual Activity  . Alcohol use: No  . Drug use: No  . Sexual activity: Not on file  Lifestyle  . Physical activity:    Days per week: Not on file    Minutes per session: Not on file  . Stress: Not on file  Relationships  . Social connections:    Talks on phone: Not on file    Gets together: Not on file    Attends  religious service: Not on file    Active member of club or organization: Not on file    Attends meetings of clubs or organizations: Not on file    Relationship status: Not on file  . Intimate partner violence:    Fear of current or ex partner: Not on file    Emotionally abused: Not on file    Physically abused: Not on file    Forced sexual activity: Not on file  Other Topics Concern  . Not on file  Social History Narrative   Lives with wife     PHYSICAL EXAM   VIDEO EXAM  GENERAL EXAM/CONSTITUTIONAL:  Vitals: There were no vitals filed for this visit.  There is no height or weight on file to calculate BMI. Wt Readings from Last 3 Encounters:  04/26/19 255 lb (115.7 kg)  04/07/19 262  lb (118.8 kg)     Patient is in no distress; well developed, nourished and groomed; neck is supple   NEUROLOGIC: MENTAL STATUS:  No flowsheet data found.  awake, alert, oriented to person, place and time  recent and remote memory intact  normal attention and concentration  language fluent, comprehension intact, naming intact  fund of knowledge appropriate  CRANIAL NERVE:   2nd, 3rd, 4th, 6th - visual fields full to confrontation, extraocular muscles intact, no nystagmus  5th - facial sensation --> DECR TO LT IN LEFT V2, V3   7th - facial strength symmetric  8th - hearing intact  11th - shoulder shrug symmetric  12th - tongue protrusion midline  MOTOR:   NO TREMOR; NO DRIFT IN BUE  SENSORY:   normal and symmetric to light touch  COORDINATION:   fine finger movements normal      DIAGNOSTIC DATA (LABS, IMAGING, TESTING) - I reviewed patient records, labs, notes, testing and imaging myself where available.  No results found for: WBC, HGB, HCT, MCV, PLT No results found for: NA, K, CL, CO2, GLUCOSE, BUN, CREATININE, CALCIUM, PROT, ALBUMIN, AST, ALT, ALKPHOS, BILITOT, GFRNONAA, GFRAA No results found for: CHOL, HDL, LDLCALC, LDLDIRECT, TRIG, CHOLHDL No results found for: ZOXW9U No results found for: VITAMINB12 No results found for: TSH   08/31/18 MRA HEAD [I reviewed images myself and agree with interpretation. -VRP]  - normal   ASSESSMENT AND PLAN  58 y.o. year old male here with:  Dx:  1. TIA (transient ischemic attack)   2. Numbness and tingling of left side of face      Virtual Visit via Video Note  I connected with Matthew Hayes on 05/02/19 at  9:00 AM EDT by a video enabled telemedicine application and verified that I am speaking with the correct person using two identifiers.  Location: Patient: home  Provider: office   I discussed the limitations of evaluation and management by telemedicine and the availability of in person  appointments. The patient expressed understanding and agreed to proceed.   I discussed the assessment and treatment plan with the patient. The patient was provided an opportunity to ask questions and all were answered. The patient agreed with the plan and demonstrated an understanding of the instructions.   The patient was advised to call back or seek an in-person evaluation if the symptoms worsen or if the condition fails to improve as anticipated.  I provided 45 minutes of non-face-to-face time during this encounter.   PLAN:  59 y.o. with left face and arm numbness since August 2019.  Ddx: TIA (less likely), seizure (partial seizure) or complicated  migraine  INTERMITTENT NUMBNESS (left face and left arm) - check MRI brain, carotid u/s echocardiogram, EEG - start aspirin 81mg  daily - continue BP and lipid control   Orders Placed This Encounter  Procedures  . MR BRAIN W WO CONTRAST  . ECHOCARDIOGRAM COMPLETE  . EEG adult   Return in about 6 months (around 11/02/2019).    Suanne MarkerVIKRAM R. Keishawna Carranza, MD 05/02/2019, 9:08 AM Certified in Neurology, Neurophysiology and Neuroimaging  [facility] [address], [address] [place], [place] [address] [phone]

## 2019-05-04 ENCOUNTER — Telehealth: Payer: Self-pay | Admitting: Diagnostic Neuroimaging

## 2019-05-04 NOTE — Telephone Encounter (Signed)
no to the covid-19 questions MR Brain w/wo contrast Dr. Marjory Lies [facility] Auth: [id number] (exp. 05/04/19 to 06/18/19). Patient is scheduled at [facility] for 05/18/19.

## 2019-05-10 ENCOUNTER — Other Ambulatory Visit: Payer: Self-pay

## 2019-05-10 ENCOUNTER — Ambulatory Visit: Payer: 59

## 2019-05-10 DIAGNOSIS — R202 Paresthesia of skin: Secondary | ICD-10-CM

## 2019-05-10 DIAGNOSIS — G459 Transient cerebral ischemic attack, unspecified: Secondary | ICD-10-CM | POA: Diagnosis not present

## 2019-05-10 DIAGNOSIS — R200 Anesthesia of skin: Secondary | ICD-10-CM | POA: Diagnosis not present

## 2019-05-10 MED ORDER — GADOBENATE DIMEGLUMINE 529 MG/ML IV SOLN
20.0000 mL | Freq: Once | INTRAVENOUS | Status: AC | PRN
  Administered 2019-05-10: 10:00:00 20 mL via INTRAVENOUS

## 2019-05-12 DIAGNOSIS — R7301 Impaired fasting glucose: Secondary | ICD-10-CM | POA: Diagnosis not present

## 2019-05-12 DIAGNOSIS — I10 Essential (primary) hypertension: Secondary | ICD-10-CM | POA: Diagnosis not present

## 2019-05-12 DIAGNOSIS — R079 Chest pain, unspecified: Secondary | ICD-10-CM | POA: Diagnosis not present

## 2019-05-12 DIAGNOSIS — Z125 Encounter for screening for malignant neoplasm of prostate: Secondary | ICD-10-CM | POA: Diagnosis not present

## 2019-05-12 DIAGNOSIS — E785 Hyperlipidemia, unspecified: Secondary | ICD-10-CM | POA: Diagnosis not present

## 2019-05-12 DIAGNOSIS — R1310 Dysphagia, unspecified: Secondary | ICD-10-CM | POA: Diagnosis not present

## 2019-05-12 DIAGNOSIS — K219 Gastro-esophageal reflux disease without esophagitis: Secondary | ICD-10-CM | POA: Diagnosis not present

## 2019-05-16 ENCOUNTER — Encounter: Payer: Self-pay | Admitting: *Deleted

## 2019-05-16 ENCOUNTER — Ambulatory Visit: Payer: 59 | Admitting: Cardiology

## 2019-05-16 DIAGNOSIS — R112 Nausea with vomiting, unspecified: Secondary | ICD-10-CM | POA: Diagnosis not present

## 2019-05-16 DIAGNOSIS — K3189 Other diseases of stomach and duodenum: Secondary | ICD-10-CM | POA: Diagnosis not present

## 2019-05-16 DIAGNOSIS — R079 Chest pain, unspecified: Secondary | ICD-10-CM | POA: Diagnosis not present

## 2019-05-16 DIAGNOSIS — R1310 Dysphagia, unspecified: Secondary | ICD-10-CM | POA: Diagnosis not present

## 2019-05-16 DIAGNOSIS — K219 Gastro-esophageal reflux disease without esophagitis: Secondary | ICD-10-CM | POA: Diagnosis not present

## 2019-05-18 ENCOUNTER — Other Ambulatory Visit: Payer: 59

## 2019-05-24 NOTE — Progress Notes (Signed)
\   Virtual Visit via Video Note   Subjective:   Matthew Hayes, male    DOB: 07/01/1961, 58 y.o.   MRN: [id number]   I connected with the patient on 05/25/19 by a video enabled telemedicine application and verified that I am speaking with the correct person using two identifiers.     I discussed the limitations of evaluation and management by telemedicine and the availability of in person appointments. The patient expressed understanding and agreed to proceed.   This visit type was conducted due to national recommendations for restrictions regarding the COVID-19 Pandemic (e.g. social distancing).  This format is felt to be most appropriate for this patient at this time.  All issues noted in this document were discussed and addressed.  No physical exam was performed (except for noted visual exam findings with Tele health visits).  The patient has consented to conduct a Tele health visit and understands insurance will be billed.     Chief complaint:  Palpitations   HPI  58 y.o. [place] male with hypertension, palpitations.  At last visit 3 weeks ago, he was still having palpitations. He wanted to come off losartan and was convinced it is due to the medication. While I remained skeptical, I felt it was reasonable to try this. I decreased it to half tab a day and then stop. I offered to start him on a beta blocker, but he would like to hold off for now. If symptoms persisted after 3 weeks, I recommended placing an event monitor.   I also reviewed his exercise treadmill stress test from 10/2018. His ST depressions are <1 mm upsloping/horizontal, and are equivocal of ischemia, at best. Coupled with excellent exercise capacity and absence of angina symptoms, I did not think his stress test was positive for ischemia. I conitnued statin.   He cut losartan back to 1/2, then went back to 1 tab daily. Palpitations have self resolved.  Since then, he had an endoscopy, and was told that he has acid  reflux. He had brain MRI given numbness, which was normal.   He walks [id number] steps/day 4 days a week. He denies chest pain on walking. He has no more than usual, nonlimiting shortness of breath on walking up the hill.   Blood pressure is elevated today, but he states that it is usually lower than this.  Past Medical History:  Diagnosis Date  . Hyperlipidemia   . Hypertension   . Paresthesia    left face      Social History   Socioeconomic History  . Marital status: Married    Spouse name: Toniann FailWendy  . Number of children: 3  . Years of education: Not on file  . Highest education level: Not on file  Occupational History    Comment: [employer]  Social Needs  . Financial resource strain: Not on file  . Food insecurity:    Worry: Not on file    Inability: Not on file  . Transportation needs:    Medical: Not on file    Non-medical: Not on file  Tobacco Use  . Smoking status: Never Smoker  . Smokeless tobacco: Never Used  Substance and Sexual Activity  . Alcohol use: No  . Drug use: No  . Sexual activity: Not on file  Lifestyle  . Physical activity:    Days per week: Not on file    Minutes per session: Not on file  . Stress: Not on file  Relationships  .  Social connections:    Talks on phone: Not on file    Gets together: Not on file    Attends religious service: Not on file    Active member of club or organization: Not on file    Attends meetings of clubs or organizations: Not on file    Relationship status: Not on file  . Intimate partner violence:    Fear of current or ex partner: Not on file    Emotionally abused: Not on file    Physically abused: Not on file    Forced sexual activity: Not on file  Other Topics Concern  . Not on file  Social History Narrative   Lives with wife     Family History  Problem Relation Age of Onset  . Stroke Mother   . Hypertension Father   . Hypertension Sister   . Hypertension Brother      Current  Outpatient Medications on File Prior to Visit  Medication Sig Dispense Refill  . diclofenac (VOLTAREN) 75 MG EC tablet Take 1 tablet (75 mg total) by mouth 2 (two) times daily. 50 tablet 2  . losartan (COZAAR) 100 MG tablet Take 100 mg by mouth daily.    . Multiple Vitamins-Minerals (MULTIVITAMIN WITH MINERALS) tablet Take 1 tablet by mouth daily.    . simvastatin (ZOCOR) 40 MG tablet Take 40 mg by mouth every evening.  1   No current facility-administered medications on file prior to visit.     Cardiovascular studies:  EKG 04/07/2019: Sinus rhythm 87 bpm Normal EKG  Exercise Treadmill Stress Test 11/08/2018:  Indication: Chest pain The patient exercised on Bruce protocol for 09:04 min. Patient achieved 10.24 METS and reached HR 180 bpm, which is 127 % of maximum age-predicted HR. Stress test terminated due to fatigue.   Exercise capacity was normal. HR Response to Exercise: Appropriate. BP Response to Exercise: Normal resting BP- appropriate response. Chest Pain: none. Arrhythmias: none. Resting EKG demonstrates Normal sinus rhythm. ST Changes: With peak exercise, there were 1 mm horizontal ST depressions in leads V4-V6 that persisted two min into recovery.   Overall Impression: Positive stress test typical of ischemia. Recommendations: Consider further cardiac evaluation , if clinically indicated.   Review of Systems  Constitution: Negative for decreased appetite, malaise/fatigue, weight gain and weight loss.  HENT: Negative for congestion.   Eyes: Negative for visual disturbance.  Cardiovascular: Positive for palpitations. Negative for chest pain, dyspnea on exertion, leg swelling and syncope.  Respiratory: Negative for shortness of breath.   Endocrine: Negative for cold intolerance.  Hematologic/Lymphatic: Does not bruise/bleed easily.  Skin: Negative for itching and rash.  Musculoskeletal: Negative for myalgias.  Gastrointestinal: Negative for abdominal pain,  nausea and vomiting.  Genitourinary: Negative for dysuria.  Neurological: Negative for dizziness and weakness.  Psychiatric/Behavioral: The patient is not nervous/anxious.   All other systems reviewed and are negative.        Vitals:   05/25/19 0838  BP: (!) 156/97   (Measured by the patient using a home BP monitor)   Observation/findings during video visit   Objective:    Physical Exam  Constitutional: He is oriented to person, place, and time. He appears well-developed and well-nourished. No distress.  Pulmonary/Chest: Effort normal.  Neurological: He is alert and oriented to person, place, and time.  Psychiatric: He has a normal mood and affect.  Nursing note and vitals reviewed.         Assessment & Recommendations:   58 y.o. Caucasian male  with here for acute visit for palpitations.   1. Palpitations: Improved. Echocardiogram pending.   2. Hypertension: Suboptimal today. He is reluctant to add any medications at this time.   3. H/o abnormal stress test: I have re reviewed his exercise treadmill stress test from 10/2018. His ST depressions are <1 mm upsloping/horizontal, and are equivocal of ischemia, at best. Coupled with excellent exercise capacity and absence of angina symptoms, I do not think his stress test is positive for ischemia. Conitnue statin for now.   BP check visit after echocardiogram in two weeks.   Elder Negus, MD [facility] Pager: [phone] Office: [phone] If no answer Cell [phone]

## 2019-05-25 ENCOUNTER — Ambulatory Visit (INDEPENDENT_AMBULATORY_CARE_PROVIDER_SITE_OTHER): Payer: 59 | Admitting: Cardiology

## 2019-05-25 ENCOUNTER — Encounter: Payer: Self-pay | Admitting: Cardiology

## 2019-05-25 ENCOUNTER — Other Ambulatory Visit: Payer: Self-pay

## 2019-05-25 VITALS — BP 156/97 | Ht 69.0 in | Wt 250.0 lb

## 2019-05-25 DIAGNOSIS — R002 Palpitations: Secondary | ICD-10-CM

## 2019-05-25 DIAGNOSIS — I10 Essential (primary) hypertension: Secondary | ICD-10-CM | POA: Diagnosis not present

## 2019-05-30 ENCOUNTER — Ambulatory Visit (INDEPENDENT_AMBULATORY_CARE_PROVIDER_SITE_OTHER): Payer: 59 | Admitting: Diagnostic Neuroimaging

## 2019-05-30 ENCOUNTER — Other Ambulatory Visit: Payer: Self-pay

## 2019-05-30 DIAGNOSIS — R2990 Unspecified symptoms and signs involving the nervous system: Secondary | ICD-10-CM

## 2019-05-30 DIAGNOSIS — R200 Anesthesia of skin: Secondary | ICD-10-CM

## 2019-05-30 DIAGNOSIS — R202 Paresthesia of skin: Secondary | ICD-10-CM

## 2019-05-30 DIAGNOSIS — G459 Transient cerebral ischemic attack, unspecified: Secondary | ICD-10-CM

## 2019-05-31 NOTE — Procedures (Signed)
   [facility]  EEG (ELECTROENCEPHALOGRAM) REPORT   STUDY DATE: 05/30/19 PATIENT NAME: Matthew Hayes DOB: April 19, 1961 MRN: [id number]  ORDERING CLINICIAN: Joycelyn Schmid, MD   TECHNOLOGIST: Charlett Blake TECHNIQUE: Electroencephalogram was recorded utilizing standard 10-20 system of lead placement and reformatted into average and bipolar montages.  RECORDING TIME: 23 minutes  ACTIVATION: hyperventilation and photic stimulation  CLINICAL INFORMATION: 58 year old male with numbness.  FINDINGS: Posterior dominant background rhythms, which attenuate with eye opening, ranging 9-10 hertz and 30-40 microvolts. No focal, lateralizing, epileptiform activity or seizures are seen. Patient recorded in the awake and drowsy state.   IMPRESSION:   Normal EEG in the awake and drowsy states.    INTERPRETING PHYSICIAN:  Suanne Marker, MD Certified in Neurology, Neurophysiology and Neuroimaging  [facility] [address] [place], [place] [address] [phone]

## 2019-06-01 ENCOUNTER — Telehealth: Payer: Self-pay | Admitting: *Deleted

## 2019-06-01 NOTE — Telephone Encounter (Signed)
Spoke with patient and informed him his EEG result was normal. I advised he continue S+ASA, BP and lipid control, monitor symptoms and call us for any questions or concerns. Scheduled his 6 month FU. Patient verbalized understanding, appreciation.

## 2019-06-13 ENCOUNTER — Ambulatory Visit (INDEPENDENT_AMBULATORY_CARE_PROVIDER_SITE_OTHER): Payer: 59

## 2019-06-13 ENCOUNTER — Other Ambulatory Visit: Payer: Self-pay

## 2019-06-13 ENCOUNTER — Encounter: Payer: 59 | Admitting: Cardiology

## 2019-06-13 VITALS — BP 134/86 | HR 71

## 2019-06-13 DIAGNOSIS — G459 Transient cerebral ischemic attack, unspecified: Secondary | ICD-10-CM | POA: Diagnosis not present

## 2019-06-13 DIAGNOSIS — R200 Anesthesia of skin: Secondary | ICD-10-CM

## 2019-06-13 DIAGNOSIS — R202 Paresthesia of skin: Secondary | ICD-10-CM

## 2019-06-13 DIAGNOSIS — I10 Essential (primary) hypertension: Secondary | ICD-10-CM

## 2019-06-20 ENCOUNTER — Ambulatory Visit: Payer: 59 | Admitting: Cardiology

## 2019-06-24 NOTE — Telephone Encounter (Signed)
Please respond

## 2019-08-28 NOTE — Progress Notes (Signed)
Subjective:   Matthew Hayes, male    DOB: 10/31/1961, 58 y.o.   MRN: [id number]   Chief complaint:  Palpitations   HPI  58 y/o Caucasian make w/hypertension, h/o abnormal treadmill stress test, palpitations.  His palpitations/burning sensation has improved after treatment of acid reflux.  He continues to walk up to 18,000 steps every day without any complaints of chest pain or shortness of breath.  His only complaint remains low energy level.  He does endorse snoring at night.  Blood pressure elevated in the office today, but lower at home per the patient.  Past Medical History:  Diagnosis Date  . Hyperlipidemia   . Hypertension   . Paresthesia    left face      Social History   Socioeconomic History  . Marital status: Married    Spouse name: Toniann FailWendy  . Number of children: 3  . Years of education: Not on file  . Highest education level: Not on file  Occupational History    Comment: [employer]  Social Needs  . Financial resource strain: Not on file  . Food insecurity    Worry: Not on file    Inability: Not on file  . Transportation needs    Medical: Not on file    Non-medical: Not on file  Tobacco Use  . Smoking status: Never Smoker  . Smokeless tobacco: Never Used  Substance and Sexual Activity  . Alcohol use: No  . Drug use: No  . Sexual activity: Not on file  Lifestyle  . Physical activity    Days per week: Not on file    Minutes per session: Not on file  . Stress: Not on file  Relationships  . Social Musicianconnections    Talks on phone: Not on file    Gets together: Not on file    Attends religious service: Not on file    Active member of club or organization: Not on file    Attends meetings of clubs or organizations: Not on file    Relationship status: Not on file  . Intimate partner violence    Fear of current or ex partner: Not on file    Emotionally abused: Not on file    Physically abused: Not on file    Forced sexual activity:  Not on file  Other Topics Concern  . Not on file  Social History Narrative   Lives with wife     Family History  Problem Relation Age of Onset  . Stroke Mother   . Hypertension Father   . Hypertension Sister   . Hypertension Brother      Current Outpatient Medications on File Prior to Visit  Medication Sig Dispense Refill  . losartan (COZAAR) 100 MG tablet Take 100 mg by mouth daily.    . Multiple Vitamins-Minerals (MULTIVITAMIN WITH MINERALS) tablet Take 1 tablet by mouth daily.    . simvastatin (ZOCOR) 40 MG tablet Take 40 mg by mouth every evening.  1   No current facility-administered medications on file prior to visit.     Cardiovascular studies:  Echocardiogram 06/13/2019: Normal LV systolic function with EF 64%. Left ventricle cavity is normal in size. Normal global wall motion. Indeterminate diastolic filling pattern. Calculated EF 64%. Left atrial cavity is moderately dilated. Structurally normal mitral valve. Mild (Grade I) mitral regurgitation. Mild tricuspid regurgitation.  No evidence of pulmonary hypertension.  EKG 04/07/2019: Sinus rhythm 87 bpm Normal EKG  Exercise Treadmill Stress Test 11/08/2018:  Indication: Chest pain The patient exercised on Bruce protocol for 09:04 min. Patient achieved 10.24 METS and reached HR 180 bpm, which is 127 % of maximum age-predicted HR. Stress test terminated due to fatigue.   Exercise capacity was normal. HR Response to Exercise: Appropriate. BP Response to Exercise: Normal resting BP- appropriate response. Chest Pain: none. Arrhythmias: none. Resting EKG demonstrates Normal sinus rhythm. ST Changes: With peak exercise, there were 1 mm horizontal ST depressions in leads V4-V6 that persisted two min into recovery.   Overall Impression: Positive stress test typical of ischemia. Recommendations: Consider further cardiac evaluation , if clinically indicated.   Review of Systems  Constitution: Negative for  decreased appetite, malaise/fatigue, weight gain and weight loss.  HENT: Negative for congestion.   Eyes: Negative for visual disturbance.  Cardiovascular: Positive for palpitations. Negative for chest pain, dyspnea on exertion, leg swelling and syncope.  Respiratory: Negative for shortness of breath.   Endocrine: Negative for cold intolerance.  Hematologic/Lymphatic: Does not bruise/bleed easily.  Skin: Negative for itching and rash.  Musculoskeletal: Negative for myalgias.  Gastrointestinal: Negative for abdominal pain, nausea and vomiting.  Genitourinary: Negative for dysuria.  Neurological: Negative for dizziness and weakness.  Psychiatric/Behavioral: The patient is not nervous/anxious.   All other systems reviewed and are negative.        Vitals:   08/29/19 1005  BP: (!) 155/88  Pulse: 75  SpO2: 99%    Objective:    Physical Exam  Constitutional: He is oriented to person, place, and time. He appears well-developed and well-nourished. No distress.  HENT:  Head: Normocephalic and atraumatic.  Eyes: Pupils are equal, round, and reactive to light. Conjunctivae are normal.  Neck: No JVD present.  Cardiovascular: Normal rate, regular rhythm and intact distal pulses.  Pulmonary/Chest: Effort normal and breath sounds normal. He has no wheezes. He has no rales.  Abdominal: Soft. Bowel sounds are normal. There is no rebound.  Musculoskeletal:        General: No edema.  Lymphadenopathy:    He has no cervical adenopathy.  Neurological: He is alert and oriented to person, place, and time. No cranial nerve deficit.  Skin: Skin is warm and dry.  Psychiatric: He has a normal mood and affect.  Nursing note and vitals reviewed.      Assessment & Recommendations:   58 y/o Caucasian make w/hypertension, h/o abnormal treadmill stress test, palpitations.  1. Palpitations: Improved.  Structurally normal heart.  2. Hypertension: Suboptimal today. He is reluctant to add any  medications at this time.  Recommend regular exercise and weight loss.  3. H/o abnormal stress test: I have re reviewed his exercise treadmill stress test from 10/2018. His ST depressions are <1 mm upsloping/horizontal, and are equivocal of ischemia, at best. Coupled with excellent exercise capacity and absence of angina symptoms, I do not think his stress test is positive for ischemia. Conitnue statin for now.   4.  Fatigue, low energy: Multifactorial.  Suspect sleep apnea, and obesity contributing.  He is reluctant to refer to sleep study at this time.  Continue follow-up with PCP.  Follow-up in 1 year.  Nigel Mormon, MD [facility] Pager: [phone] Office: [phone] If no answer Cell [phone]

## 2019-08-29 ENCOUNTER — Encounter: Payer: Self-pay | Admitting: Cardiology

## 2019-08-29 ENCOUNTER — Ambulatory Visit (INDEPENDENT_AMBULATORY_CARE_PROVIDER_SITE_OTHER): Payer: 59 | Admitting: Cardiology

## 2019-08-29 ENCOUNTER — Other Ambulatory Visit: Payer: Self-pay

## 2019-08-29 VITALS — BP 155/88 | HR 75 | Ht 69.0 in | Wt 242.0 lb

## 2019-08-29 DIAGNOSIS — I10 Essential (primary) hypertension: Secondary | ICD-10-CM | POA: Diagnosis not present

## 2019-08-29 DIAGNOSIS — R002 Palpitations: Secondary | ICD-10-CM | POA: Diagnosis not present

## 2019-08-29 DIAGNOSIS — E785 Hyperlipidemia, unspecified: Secondary | ICD-10-CM

## 2019-08-29 DIAGNOSIS — E7800 Pure hypercholesterolemia, unspecified: Secondary | ICD-10-CM | POA: Diagnosis not present

## 2019-08-29 HISTORY — DX: Hyperlipidemia, unspecified: E78.5

## 2019-11-08 ENCOUNTER — Other Ambulatory Visit: Payer: Self-pay

## 2019-11-08 ENCOUNTER — Encounter: Payer: Self-pay | Admitting: Diagnostic Neuroimaging

## 2019-11-08 ENCOUNTER — Ambulatory Visit: Payer: 59 | Admitting: Diagnostic Neuroimaging

## 2019-11-08 VITALS — BP 128/78 | HR 78 | Temp 97.7°F | Ht 69.0 in | Wt 244.4 lb

## 2019-11-08 DIAGNOSIS — R200 Anesthesia of skin: Secondary | ICD-10-CM

## 2019-11-08 DIAGNOSIS — R202 Paresthesia of skin: Secondary | ICD-10-CM | POA: Diagnosis not present

## 2019-11-08 DIAGNOSIS — G43109 Migraine with aura, not intractable, without status migrainosus: Secondary | ICD-10-CM | POA: Diagnosis not present

## 2019-11-08 NOTE — Progress Notes (Signed)
[facility]  PATIENT: Matthew Hayes DOB: 1961-03-06  REFERRING CLINICIAN: C White HISTORY FROM: patient REASON FOR VISIT: new consult    HISTORICAL  CHIEF COMPLAINT:  Chief Complaint  Patient presents with  . Transient Ischemic Attack    rm 7, 6 month FU, "numbness in my face comes and goes, but in last 2 weeks its been consistent; Sunday the left side of my face and left arm had unusual sensation"    HISTORY OF PRESENT ILLNESS:   UPDATE (11/08/19, [facility]): Since last visit, doing about the same. Ongoing intermittent mild left sided HA. Intermittent numbness in left face and left arm (can last 1 hour up to 1-2 days at a time).  No triggers. Eating healthy. Exercising daily.   PRIOR HPI (05/02/19): 58 year old male with hypertension, hypercholesterolemia, here for evaluation of numbness since August 2019.  Patient had onset of left face and left arm numbness lasting for few minutes at a time in August 2018.  This was happening several times per week.  He had a severe headache at onset of symptoms.  Patient went to the hospital in [facility] for evaluation.  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  This was followed up with MRA of the head in [place] in September 2019 which was normal.  Patient continues to have episodes at least twice per week.  No specific triggering or aggravating factors.  No weakness.  No slurred speech, vision changes, trouble walking anything with his hand.  No ongoing headaches.  No twitching or convulsions.    REVIEW OF SYSTEMS: Full 14 system review of systems performed and negative with exception of: As per HPI.  ALLERGIES: Allergies  Allergen Reactions  . Vicodin [Hydrocodone-Acetaminophen] Nausea Only    Sick on stomach    HOME MEDICATIONS: Outpatient Medications Prior to Visit  Medication Sig Dispense Refill  . aspirin EC  81 MG tablet Take 81 mg by mouth daily.    Marland Kitchen losartan (COZAAR) 100 MG tablet Take 100 mg by mouth daily.    . Multiple Vitamins-Minerals (MULTIVITAMIN WITH MINERALS) tablet Take 1 tablet by mouth daily.    . simvastatin (ZOCOR) 40 MG tablet Take 40 mg by mouth every evening.  1   No facility-administered medications prior to visit.     PAST MEDICAL HISTORY: Past Medical History:  Diagnosis Date  . Hyperlipemia 08/29/2019  . Hyperlipidemia   . Hypertension   . Paresthesia    left face  . TIA (transient ischemic attack)     PAST SURGICAL HISTORY: No past surgical history on file.  FAMILY HISTORY: Family History  Problem Relation Age of Onset  . Stroke Mother   . Hypertension Father   . Hypertension Sister   . Hypertension Brother     SOCIAL HISTORY: Social History   Socioeconomic History  . Marital status: Married    Spouse name: Toniann Fail  . Number of children: 3  . Years of education: Not on file  . Highest education level: Not on file  Occupational History    Comment: [employer]  Social Needs  . Financial resource strain: Not on file  . Food insecurity    Worry: Not on file    Inability: Not on file  . Transportation needs    Medical: Not on file    Non-medical: Not on file  Tobacco Use  . Smoking  status: Never Smoker  . Smokeless tobacco: Never Used  Substance and Sexual Activity  . Alcohol use: No  . Drug use: No  . Sexual activity: Not on file  Lifestyle  . Physical activity    Days per week: Not on file    Minutes per session: Not on file  . Stress: Not on file  Relationships  . Social Musicianconnections    Talks on phone: Not on file    Gets together: Not on file    Attends religious service: Not on file    Active member of club or organization: Not on file    Attends meetings of clubs or organizations: Not on file    Relationship status: Not on file  . Intimate partner violence    Fear of current or ex partner: Not on file     Emotionally abused: Not on file    Physically abused: Not on file    Forced sexual activity: Not on file  Other Topics Concern  . Not on file  Social History Narrative   Lives with wife     PHYSICAL EXAM  GENERAL EXAM/CONSTITUTIONAL: Vitals:  Vitals:   11/08/19 0809  BP: 128/78  Pulse: 78  Temp: 97.7 F (36.5 C)  Weight: 244 lb 6.4 oz (110.9 kg)  Height: 5\' 9"  (1.753 m)     Body mass index is 36.09 kg/m. Wt Readings from Last 3 Encounters:  11/08/19 244 lb 6.4 oz (110.9 kg)  08/29/19 242 lb (109.8 kg)  05/25/19 250 lb (113.4 kg)     Patient is in no distress; well developed, nourished and groomed; neck is supple  CARDIOVASCULAR:  Examination of carotid arteries is normal; no carotid bruits  Regular rate and rhythm, no murmurs  Examination of peripheral vascular system by observation and palpation is normal  EYES:  Ophthalmoscopic exam of optic discs and posterior segments is normal; no papilledema or hemorrhages  No exam data present  MUSCULOSKELETAL:  Gait, strength, tone, movements noted in Neurologic exam below  NEUROLOGIC: MENTAL STATUS:  No flowsheet data found.  awake, alert, oriented to person, place and time  recent and remote memory intact  normal attention and concentration  language fluent, comprehension intact, naming intact  fund of knowledge appropriate  CRANIAL NERVE:   2nd - no papilledema on fundoscopic exam  2nd, 3rd, 4th, 6th - pupils equal and reactive to light, visual fields full to confrontation, extraocular muscles intact, no nystagmus  5th - facial sensation symmetric  7th - facial strength symmetric  8th - hearing intact  9th - palate elevates symmetrically, uvula midline  11th - shoulder shrug symmetric  12th - tongue protrusion midline  MOTOR:   normal bulk and tone, full strength in the BUE, BLE  SENSORY:   normal and symmetric to light touch, temperature, vibration  COORDINATION:    finger-nose-finger, fine finger movements normal  REFLEXES:   deep tendon reflexes present and symmetric  GAIT/STATION:   narrow based gait     DIAGNOSTIC DATA (LABS, IMAGING, TESTING) - I reviewed patient records, labs, notes, testing and imaging myself where available.  No results found for: WBC, HGB, HCT, MCV, PLT No results found for: NA, K, CL, CO2, GLUCOSE, BUN, CREATININE, CALCIUM, PROT, ALBUMIN, AST, ALT, ALKPHOS, BILITOT, GFRNONAA, GFRAA No results found for: CHOL, HDL, LDLCALC, LDLDIRECT, TRIG, CHOLHDL No results found for: [id number] No results found for: VITAMINB12 No results found for: TSH   08/31/18 MRA HEAD [I reviewed images myself and agree with  interpretation. -[facility]P]  - normal  05/10/19 MRI brain  - normal   06/13/19 carotid u/s Minimal stenosis in the left internal carotid artery bulb (<50%). Minimal stenosis in the left external carotid artery (<50%). There is very mild soft plaque bilateral carotid arteries without hemodynamically significant stenosis. Antegrade right vertebral artery flow. Antegrade left vertebral artery flow.  06/13/19 TTE Normal LV systolic function with EF 64%. Left ventricle cavity is normal in size. Normal global wall motion. Indeterminate diastolic filling pattern. Calculated EF 64%. Left atrial cavity is moderately dilated. Structurally normal mitral valve. Mild (Grade I) mitral regurgitation. Mild tricuspid regurgitation.  No evidence of pulmonary hypertension.  05/30/19 EEG - normal     ASSESSMENT AND PLAN  58 y.o. year old male here with:  Dx:  1. Numbness and tingling of left side of face   2. Complicated migraine       PLAN:  58 y.o. with left face and arm numbness since August 2019.  Dx: complicated migraine (TIA, seizure less likely)   INTERMITTENT NUMBNESS (left face and left arm) - continue aspirin 81mg  daily - continue BP and lipid control  - consider topiramate for migraine prevention  Return for  return to PCP, pending if symptoms worsen or fail to improve.    Penni Bombard, MD 78/58/8502, 7:74 AM Certified in Neurology, Neurophysiology and Neuroimaging  [facility] [address] [place], [place] [address] [phone]

## 2020-08-03 ENCOUNTER — Telehealth: Payer: Self-pay

## 2020-08-03 NOTE — Telephone Encounter (Signed)
done

## 2020-08-06 ENCOUNTER — Ambulatory Visit: Payer: 59 | Admitting: Cardiology

## 2020-08-26 NOTE — Progress Notes (Signed)
Subjective:   Matthew Hayes, male    DOB: Mar 20, 1961, 59 y.o.   MRN: [id number]   Chief complaint:  Palpitations   HPI  59 y/o Caucasian make w/hypertension, h/o abnormal treadmill stress test, palpitations.  His palpitations/burning sensation has improved after treatment of acid reflux.  He continues to walk around 15,000 steps every day without any complaints of chest pain or shortness of breath.    Current Outpatient Medications on File Prior to Visit  Medication Sig Dispense Refill   aspirin EC 81 MG tablet Take 81 mg by mouth daily.     losartan (COZAAR) 100 MG tablet Take 100 mg by mouth daily.     Multiple Vitamins-Minerals (MULTIVITAMIN WITH MINERALS) tablet Take 1 tablet by mouth daily.     simvastatin (ZOCOR) 40 MG tablet Take 40 mg by mouth every evening.  1   No current facility-administered medications on file prior to visit.    Cardiovascular studies:  EKG 08/27/2020: Sinus rhythm 89 bpm Poor R wave progression Cannot exclude old inferior infarct  Low voltage in precordial leads   Echocardiogram 06/13/2019: Normal LV systolic function with EF 64%. Left ventricle cavity is normal in size. Normal global wall motion. Indeterminate diastolic filling pattern. Calculated EF 64%. Left atrial cavity is moderately dilated. Structurally normal mitral valve. Mild (Grade I) mitral regurgitation. Mild tricuspid regurgitation.  No evidence of pulmonary hypertension.  EKG 04/07/2019: Sinus rhythm 87 bpm Normal EKG  Exercise Treadmill Stress Test 11/08/2018:  Indication: Chest pain The patient exercised on Bruce protocol for 09:04 min. Patient achieved 10.24 METS and reached HR 180 bpm, which is 127 % of maximum age-predicted HR. Stress test terminated due to fatigue.   Exercise capacity was normal. HR Response to Exercise: Appropriate. BP Response to Exercise: Normal resting BP- appropriate response. Chest Pain: none. Arrhythmias: none. Resting EKG  demonstrates Normal sinus rhythm. ST Changes: With peak exercise, there were 1 mm horizontal ST depressions in leads V4-V6 that persisted two min into recovery.   Overall Impression: Positive stress test typical of ischemia. Recommendations: Consider further cardiac evaluation , if clinically indicated.   Review of Systems  Cardiovascular: Negative for chest pain, dyspnea on exertion, leg swelling, palpitations and syncope.         Vitals:   08/27/20 0832 08/27/20 0837  BP: (!) 161/92 (!) 155/93  Pulse: 87 90  Resp: 17   SpO2: 99% 99%    Objective:    Physical Exam Vitals and nursing note reviewed.  Constitutional:      General: He is not in acute distress. Neck:     Vascular: No JVD.  Cardiovascular:     Rate and Rhythm: Normal rate and regular rhythm.     Heart sounds: Normal heart sounds. No murmur heard.   Pulmonary:     Effort: Pulmonary effort is normal.     Breath sounds: Normal breath sounds. No wheezing or rales.        Assessment & Recommendations:   59 y/o Caucasian make w/hypertension, h/o abnormal treadmill stress test, palpitations.  1. Palpitations: Improved.  Structurally normal heart.  2. Hypertension: Suboptimal today. Like the last visit, he is reluctant to add any medications at this time. He states his BP is normal at home. Recommend regular exercise and weight loss.  3. H/o abnormal stress test: I have re reviewed his exercise treadmill stress test from 10/2018. His ST depressions are <1 mm upsloping/horizontal, and are equivocal of ischemia, at best. Coupled with excellent  exercise capacity and absence of angina symptoms, I do not think his stress test is positive for ischemia. Conitnue statin for now.   Will obtain labs from PCP  F/u in 1 year.  Elder Negus, MD [facility] Pager: [phone] Office: [phone] If no answer Cell [phone]

## 2020-08-27 ENCOUNTER — Other Ambulatory Visit: Payer: Self-pay

## 2020-08-27 ENCOUNTER — Ambulatory Visit: Payer: 59 | Admitting: Cardiology

## 2020-08-27 ENCOUNTER — Encounter: Payer: Self-pay | Admitting: Cardiology

## 2020-08-27 VITALS — BP 155/93 | HR 90 | Resp 17 | Ht 69.0 in | Wt 270.0 lb

## 2020-08-27 DIAGNOSIS — R002 Palpitations: Secondary | ICD-10-CM

## 2020-08-27 DIAGNOSIS — I10 Essential (primary) hypertension: Secondary | ICD-10-CM

## 2021-08-28 ENCOUNTER — Ambulatory Visit: Payer: 59 | Admitting: Cardiology

## 2023-08-25 NOTE — Progress Notes (Signed)
No action done

## 2023-09-05 NOTE — Progress Notes (Signed)
This encounter was created in error - please disregard.
